# Patient Record
Sex: Female | Born: 1990 | Race: White | Hispanic: No | Marital: Married | Smoking: Never smoker
Health system: Southern US, Community
[De-identification: ages and names within clinical notes are randomized; demographics above are authoritative.]

## PROBLEM LIST (undated history)

## (undated) DIAGNOSIS — I10 Essential (primary) hypertension: Secondary | ICD-10-CM

## (undated) DIAGNOSIS — M329 Systemic lupus erythematosus, unspecified: Secondary | ICD-10-CM

## (undated) HISTORY — PX: ABDOMINAL HYSTERECTOMY: SHX81

## (undated) HISTORY — PX: TUBAL LIGATION: SHX77

## (undated) HISTORY — PX: RENAL BIOPSY, PERCUTANEOUS: SUR144

---

## 2013-07-11 ENCOUNTER — Emergency Department (HOSPITAL_COMMUNITY): Payer: BC Managed Care – PPO

## 2013-07-11 ENCOUNTER — Emergency Department (HOSPITAL_COMMUNITY)
Admission: EM | Admit: 2013-07-11 | Discharge: 2013-07-11 | Disposition: A | Discharge status: Home / Self Care | Payer: BC Managed Care – PPO | Attending: Emergency Medicine | Admitting: Emergency Medicine

## 2013-07-11 ENCOUNTER — Encounter (HOSPITAL_COMMUNITY): Payer: Self-pay | Admitting: Emergency Medicine

## 2013-07-11 DIAGNOSIS — IMO0002 Reserved for concepts with insufficient information to code with codable children: Secondary | ICD-10-CM | POA: Insufficient documentation

## 2013-07-11 DIAGNOSIS — R7989 Other specified abnormal findings of blood chemistry: Secondary | ICD-10-CM

## 2013-07-11 DIAGNOSIS — R51 Headache: Secondary | ICD-10-CM | POA: Insufficient documentation

## 2013-07-11 DIAGNOSIS — R519 Headache, unspecified: Secondary | ICD-10-CM

## 2013-07-11 DIAGNOSIS — Z8739 Personal history of other diseases of the musculoskeletal system and connective tissue: Secondary | ICD-10-CM | POA: Insufficient documentation

## 2013-07-11 DIAGNOSIS — R112 Nausea with vomiting, unspecified: Secondary | ICD-10-CM

## 2013-07-11 DIAGNOSIS — Z79899 Other long term (current) drug therapy: Secondary | ICD-10-CM | POA: Insufficient documentation

## 2013-07-11 DIAGNOSIS — I10 Essential (primary) hypertension: Secondary | ICD-10-CM | POA: Insufficient documentation

## 2013-07-11 DIAGNOSIS — R748 Abnormal levels of other serum enzymes: Secondary | ICD-10-CM | POA: Insufficient documentation

## 2013-07-11 DIAGNOSIS — Z3202 Encounter for pregnancy test, result negative: Secondary | ICD-10-CM | POA: Insufficient documentation

## 2013-07-11 HISTORY — DX: Essential (primary) hypertension: I10

## 2013-07-11 HISTORY — DX: Systemic lupus erythematosus, unspecified: M32.9

## 2013-07-11 LAB — COMPREHENSIVE METABOLIC PANEL
ALBUMIN: 2.1 g/dL — AB (ref 3.5–5.2)
ALT: 8 U/L (ref 0–35)
AST: 12 U/L (ref 0–37)
Alkaline Phosphatase: 53 U/L (ref 39–117)
BUN: 20 mg/dL (ref 6–23)
CALCIUM: 8.1 mg/dL — AB (ref 8.4–10.5)
CHLORIDE: 115 meq/L — AB (ref 96–112)
CO2: 16 mEq/L — ABNORMAL LOW (ref 19–32)
CREATININE: 2.1 mg/dL — AB (ref 0.50–1.10)
GFR calc Af Amer: 37 mL/min — ABNORMAL LOW (ref >90)
GFR calc non Af Amer: 32 mL/min — ABNORMAL LOW (ref >90)
Glucose, Bld: 92 mg/dL (ref 70–99)
Potassium: 3.8 mEq/L (ref 3.7–5.3)
Sodium: 143 mEq/L (ref 137–147)
TOTAL PROTEIN: 5.1 g/dL — AB (ref 6.0–8.3)
Total Bilirubin: <0.2 mg/dL — ABNORMAL LOW (ref 0.3–1.2)

## 2013-07-11 LAB — CBC
HCT: 27.8 % — ABNORMAL LOW (ref 36.0–46.0)
Hemoglobin: 9.3 g/dL — ABNORMAL LOW (ref 12.0–15.0)
MCH: 30 pg (ref 26.0–34.0)
MCHC: 33.5 g/dL (ref 30.0–36.0)
MCV: 89.7 fL (ref 78.0–100.0)
PLATELETS: 190 10*3/uL (ref 150–400)
RBC: 3.1 MIL/uL — ABNORMAL LOW (ref 3.87–5.11)
RDW: 15 % (ref 11.5–15.5)
WBC: 1.9 10*3/uL — AB (ref 4.0–10.5)

## 2013-07-11 LAB — POC URINE PREG, ED: Preg Test, Ur: NEGATIVE

## 2013-07-11 LAB — LIPASE, BLOOD: Lipase: 13 U/L (ref 11–59)

## 2013-07-11 MED ORDER — PROCHLORPERAZINE EDISYLATE 5 MG/ML IJ SOLN
10.0000 mg | Freq: Once | INTRAMUSCULAR | Status: AC
  Administered 2013-07-11: 10 mg via INTRAVENOUS
  Filled 2013-07-11: qty 2

## 2013-07-11 MED ORDER — KETOROLAC TROMETHAMINE 30 MG/ML IJ SOLN
30.0000 mg | Freq: Once | INTRAMUSCULAR | Status: AC
  Administered 2013-07-11: 30 mg via INTRAVENOUS
  Filled 2013-07-11: qty 1

## 2013-07-11 MED ORDER — SODIUM CHLORIDE 0.9 % IV BOLUS (SEPSIS)
1000.0000 mL | Freq: Once | INTRAVENOUS | Status: AC
  Administered 2013-07-11: 1000 mL via INTRAVENOUS

## 2013-07-11 MED ORDER — DIPHENHYDRAMINE HCL 50 MG/ML IJ SOLN
12.5000 mg | Freq: Once | INTRAMUSCULAR | Status: AC
  Administered 2013-07-11: 12.5 mg via INTRAVENOUS
  Filled 2013-07-11: qty 1

## 2013-07-11 MED ORDER — LABETALOL HCL 5 MG/ML IV SOLN
20.0000 mg | Freq: Once | INTRAVENOUS | Status: AC
  Administered 2013-07-11: 20 mg via INTRAVENOUS
  Filled 2013-07-11: qty 4

## 2013-07-11 NOTE — ED Provider Notes (Signed)
  Medical screening examination/treatment/procedure(s) were performed by non-physician practitioner and as supervising physician I was immediately available for consultation/collaboration.   EKG Interpretation   Date/Time:  Saturday July 11 2013 19:12:34 EDT Ventricular Rate:  74 PR Interval:  165 QRS Duration: 108 QT Interval:  441 QTC Calculation: 489 R Axis:   51 Text Interpretation:  Sinus rhythm Borderline prolonged QT interval Sinus  rhythm borderline LVH Abnormal ekg Confirmed by Gerhard MunchLOCKWOOD, Les Longmore  MD  ([id number]) on 07/11/2013 7:20:03 PM         Gerhard Munchobert Ranesha Val, MD 07/11/13 2325

## 2013-07-11 NOTE — Discharge Instructions (Signed)
1. Medications: usual home medications 2. Treatment: rest, drink plenty of fluids,  3. Follow Up: Please followup with your primary doctor for discussion of your diagnoses and further evaluation after today's visit; if you do not have a primary care doctor use the resource guide provided to find one;   Hypertension As your heart beats, it forces blood through your arteries. This force is your blood pressure. If the pressure is too high, it is called hypertension (HTN) or high blood pressure. HTN is dangerous because you may have it and not know it. High blood pressure may mean that your heart has to work harder to pump blood. Your arteries may be narrow or stiff. The extra work puts you at risk for heart disease, stroke, and other problems.  Blood pressure consists of two numbers, a higher number over a lower, 110/72, for example. It is stated as "110 over 72." The ideal is below 120 for the top number (systolic) and under 80 for the bottom (diastolic). Write down your blood pressure today. You should pay close attention to your blood pressure if you have certain conditions such as:  Heart failure.  Prior heart attack.  Diabetes  Chronic kidney disease.  Prior stroke.  Multiple risk factors for heart disease. To see if you have HTN, your blood pressure should be measured while you are seated with your arm held at the level of the heart. It should be measured at least twice. A one-time elevated blood pressure reading (especially in the Emergency Department) does not mean that you need treatment. There may be conditions in which the blood pressure is different between your right and left arms. It is important to see your caregiver soon for a recheck. Most people have essential hypertension which means that there is not a specific cause. This type of high blood pressure may be lowered by changing lifestyle factors such as:  Stress.  Smoking.  Lack of exercise.  Excessive  weight.  Drug/tobacco/alcohol use.  Eating less salt. Most people do not have symptoms from high blood pressure until it has caused damage to the body. Effective treatment can often prevent, delay or reduce that damage. TREATMENT  When a cause has been identified, treatment for high blood pressure is directed at the cause. There are a large number of medications to treat HTN. These fall into several categories, and your caregiver will help you select the medicines that are best for you. Medications may have side effects. You should review side effects with your caregiver. If your blood pressure stays high after you have made lifestyle changes or started on medicines,   Your medication(s) may need to be changed.  Other problems may need to be addressed.  Be certain you understand your prescriptions, and know how and when to take your medicine.  Be sure to follow up with your caregiver within the time frame advised (usually within two weeks) to have your blood pressure rechecked and to review your medications.  If you are taking more than one medicine to lower your blood pressure, make sure you know how and at what times they should be taken. Taking two medicines at the same time can result in blood pressure that is too low. SEEK IMMEDIATE MEDICAL CARE IF:  You develop a severe headache, blurred or changing vision, or confusion.  You have unusual weakness or numbness, or a faint feeling.  You have severe chest or abdominal pain, vomiting, or breathing problems. MAKE SURE YOU:   Understand these  instructions.  Will watch your condition.  Will get help right away if you are not doing well or get worse. Document Released: 04/16/2005 Document Revised: 07/09/2011 Document Reviewed: 12/05/2007 [facility] Patient Information 2014 [place], [place].

## 2013-07-11 NOTE — ED Provider Notes (Signed)
CSN: [id number]     Arrival date & time 07/11/13  1613 History   First MD Initiated Contact with Patient 07/11/13 1646     Chief Complaint  Patient presents with  . Headache  . Hypertension     (Consider location/radiation/quality/duration/timing/severity/associated sxs/prior Treatment) The history is provided by the patient and medical records. No language interpreter was used.    Shannon Lane is a 23 y.o. female  with a hx of lupus, HTN presents to the Emergency Department complaining of gradual, persistent, progressively worsening generalized throbbing headache rated at a 9/10 onset 10am after vomiting. Pt reports that she does get headaches and reports that this headache feels the same but is more intense.  She reports that about 1/2 the time the headache will resolve after sleep, but it did not today.  She reports she checked her BP at [facility] this AM and it was high.  She reports generally feeling unwell this morning which prompted her to check her BP.  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 She is taking Labetalol, clonidine and hydralazine.  She reports she only took the hydralazine this AM, but vomited it up.  She reports that when she takes all of them, she vomits and becomes sleepy.  Pt reports that once she vomits, she takes no more of her medications for the rest of the day.  Pt report that she took tylenol this AM before vomiting and it did not help.  Nothing makes the headache better or worse.  Pt denies fever, chills, neck pain, neck stiffness, chest pain, SOB, abd pain, diarrhea, weakness, dizziness, syncope, dysuria.      Past Medical History  Diagnosis Date  . Lupus    Past Surgical History  Procedure Laterality Date  . Renal biopsy, percutaneous    . Abdominal hysterectomy    . Tubal ligation     No family history on file. History  Substance Use Topics  . Smoking status: Never Smoker   . Smokeless tobacco: Not on file  . Alcohol Use: Yes   Comment: Occasional mixed drink   OB History   Grav Para Term Preterm Abortions TAB SAB Ect Mult Living                 Review of Systems  Constitutional: Negative for fever, diaphoresis, appetite change, fatigue and unexpected weight change.  HENT: Negative for mouth sores.   Eyes: Negative for visual disturbance.  Respiratory: Negative for cough, chest tightness, shortness of breath and wheezing.   Cardiovascular: Negative for chest pain.  Gastrointestinal: Positive for nausea (now resolved). Negative for vomiting, abdominal pain, diarrhea and constipation.  Endocrine: Negative for polydipsia, polyphagia and polyuria.  Genitourinary: Negative for dysuria, urgency, frequency and hematuria.  Musculoskeletal: Negative for back pain and neck stiffness.  Skin: Negative for rash.  Allergic/Immunologic: Negative for immunocompromised state.  Neurological: Positive for headaches. Negative for syncope and light-headedness.  Hematological: Does not bruise/bleed easily.  Psychiatric/Behavioral: Negative for sleep disturbance. The patient is not nervous/anxious.       Allergies  Dapsone and Sulfa antibiotics  Home Medications   Current Outpatient Rx  Name  Route  Sig  Dispense  Refill  . acetaminophen (TYLENOL) 500 MG tablet   Oral   Take 1,500 mg by mouth every 6 (six) hours as needed for headache.         . bumetanide (BUMEX) 1 MG tablet   Oral   Take 1 mg by mouth 3 (three)  times daily.         . cloNIDine (CATAPRES) 0.2 MG tablet   Oral   Take 0.2 mg by mouth 2 (two) times daily.         . DULoxetine (CYMBALTA) 60 MG capsule   Oral   Take 60 mg by mouth daily.         . hydroxychloroquine (PLAQUENIL) 200 MG tablet   Oral   Take 400 mg by mouth every morning.         . labetalol (NORMODYNE) 200 MG tablet   Oral   Take 600 mg by mouth 3 (three) times daily.         . potassium chloride SA (K-DUR,KLOR-CON) 20 MEQ tablet   Oral   Take 1,000 mEq by mouth  daily.         . predniSONE (DELTASONE) 5 MG tablet   Oral   Take 5 mg by mouth daily.         . sodium bicarbonate 650 MG tablet   Oral   Take 650 mg by mouth daily.         . sodium chloride 0.9 % SOLN 250 mL with cyclophosphamide 1 G SOLR   Intravenous   Inject 600 mg/m2 into the vein every 30 (thirty) days.         Marland Kitchen spironolactone (ALDACTONE) 25 MG tablet   Oral   Take 50 mg by mouth daily.          BP 152/87  Pulse 91  Resp 16  SpO2 99% Physical Exam  Nursing note and vitals reviewed. Constitutional: She is oriented to person, place, and time. She appears well-developed and well-nourished. No distress.  Awake, alert, nontoxic appearance  HENT:  Head: Normocephalic and atraumatic.  Mouth/Throat: Oropharynx is clear and moist. No oropharyngeal exudate.  Eyes: Conjunctivae and EOM are normal. Pupils are equal, round, and reactive to light. No scleral icterus.  Neck: Normal range of motion and full passive range of motion without pain. Neck supple. No spinous process tenderness and no muscular tenderness present. No rigidity. Normal range of motion present. No Brudzinski's sign and no Kernig's sign noted.  Full ROM without pain No midline or paraspinal tenderness  Cardiovascular: Normal rate, regular rhythm, normal heart sounds and intact distal pulses.   No murmur heard. Pulmonary/Chest: Effort normal and breath sounds normal. No respiratory distress. She has no wheezes. She has no rales.  Abdominal: Soft. Bowel sounds are normal. She exhibits no distension and no mass. There is no tenderness. There is no rebound and no guarding.  Musculoskeletal: Normal range of motion. She exhibits no edema.  Lymphadenopathy:    She has no cervical adenopathy.  Neurological: She is alert and oriented to person, place, and time. She has normal reflexes. No cranial nerve deficit. She exhibits normal muscle tone. Coordination normal.  Speech is clear and goal oriented, follows  commands Cranial nerves III - XII without deficit, no facial droop Normal strength in upper and lower extremities bilaterally, strong and equal grip strength Sensation normal to light and sharp touch Moves extremities without ataxia, coordination intact Normal finger to nose and rapid alternating movements Neg romberg, no pronator drift Normal gait Normal heel-shin and balance   Skin: Skin is warm and dry. No rash noted. She is not diaphoretic. No erythema.  Psychiatric: She has a normal mood and affect. Her behavior is normal. Judgment and thought content normal.    ED Course  Procedures (including critical care  time) Labs Review Labs Reviewed  CBC - Abnormal; Notable for the following:    WBC 1.9 (*)    RBC 3.10 (*)    Hemoglobin 9.3 (*)    HCT 27.8 (*)    All other components within normal limits  COMPREHENSIVE METABOLIC PANEL - Abnormal; Notable for the following:    Chloride 115 (*)    CO2 16 (*)    Creatinine, Ser 2.10 (*)    Calcium 8.1 (*)    Total Protein 5.1 (*)    Albumin 2.1 (*)    Total Bilirubin <0.2 (*)    GFR calc non Af Amer 32 (*)    GFR calc Af Amer 37 (*)    All other components within normal limits  LIPASE, BLOOD  POC URINE PREG, ED   Imaging Review Ct Head Wo Contrast  07/11/2013   CLINICAL DATA:  Frontal headache  EXAM: CT HEAD WITHOUT CONTRAST  TECHNIQUE: Contiguous axial images were obtained from the base of the skull through the vertex without intravenous contrast.  COMPARISON:  None.  FINDINGS: No acute hemorrhage, infarct, or mass lesion is identified. Minimal ethmoid mucoperiosteal thickening noted. Orbits are grossly unremarkable. No skull fracture. No midline shift.  IMPRESSION: Minimal ethmoid sinusitis.  No acute intracranial findings.   Electronically Signed   By: Christiana PellantGretchen  Green M.D.   On: 07/11/2013 18:06     EKG Interpretation   Date/Time:  Saturday July 11 2013 19:12:34 EDT Ventricular Rate:  74 PR Interval:  165 QRS Duration:  108 QT Interval:  441 QTC Calculation: 489 R Axis:   51 Text Interpretation:  Sinus rhythm Borderline prolonged QT interval Sinus  rhythm borderline LVH Abnormal ekg Confirmed by Gerhard MunchLOCKWOOD, ROBERT  MD  [id number]) on 07/11/2013 7:20:03 PM      MDM   Final diagnoses:  HTN (hypertension)  Headache  Nausea and vomiting  Elevated serum creatinine   Tiburcio PeaKristen Herda presents with a Hx of lupus induced kidney dysfunction and HTN.  Today she has a headache and has had several episodes of emesis.  Symptoms today are not new to the patient as she has had them before, but they seemed more intense today.  Pt has not had her home BP medications due to her vomiting.  Low likelihood of subarachnoid hemorrhage however will obtain CT scan. Also obtain basic labs and get blood pressure control and fluids.  Patient to be given labetalol 20 mg IV.  6:15PM Patient reports improving headache. CBC with mild leukopenia and mild anemia at baseline per patient. Also noted exam creatinine at 2.10.  Pt. Reports she [place] a rheumatologist and [place]  Her creatinine last week was 2.1 therefore this is baseline for her.  Will complete fluid bolus and reassess.    7:23 PM On reassessment patient alert, oriented, nontoxic and nonseptic appearing.  Her EKG was nonischemic and her head CT was without evidence of acute intracranial abnormality.  I personally reviewed the imaging tests through PACS system  I reviewed available ER/hospitalization records through the EMR  No evidence of endorgan damage at this time as patient's elevated creatinine is at baseline for her.  She's tolerating by mouth here in the department without difficulty. She reports that she feels well enough to take her home blood pressure medications after being discharged.  Last set of vitals by me: BP (manual) 152/87 HR 88  It has been determined that no acute conditions requiring further emergency intervention are present at  this time. The patient/guardian have been advised of the diagnosis and plan. We have discussed signs and symptoms that warrant return to the ED, such as changes or worsening in symptoms.   Vital signs are stable at discharge.   BP 152/87  Pulse 91  Resp 16  SpO2 99%  Patient/guardian has voiced understanding and agreed to follow-up with the PCP or specialist.      Dierdre Forth, PA-C 07/11/13 1931

## 2013-07-11 NOTE — ED Notes (Signed)
She c/o generalized h/a today.  She recognizes this as a sign of her htn flaring.  She states she has lupus, and is on chronic steroids; cytoxan; plus several antihypertensives, including Labetolol 600mg  t.i.d.  She also states she has been nauseated today and vomits whenever she attempts to take her usual meds.  She is alert and oriented x 4 with clear speech.

## 2014-12-04 IMAGING — CT CT HEAD W/O CM
2 series · 17 of 30 positions shown, 20 images · non-contrast
Comparison: None.

CLINICAL DATA: Frontal headache

EXAM:
CT HEAD WITHOUT CONTRAST
TECHNIQUE: Contiguous axial images were obtained from the base of the skull
through the vertex without intravenous contrast.

[Series 2: head w/o · axial · non-contrast · 0.40mm/px · z∈[+1089,+1209]mm · 9 of 30 slices shown, 12 images]
[im 3/30  brain]
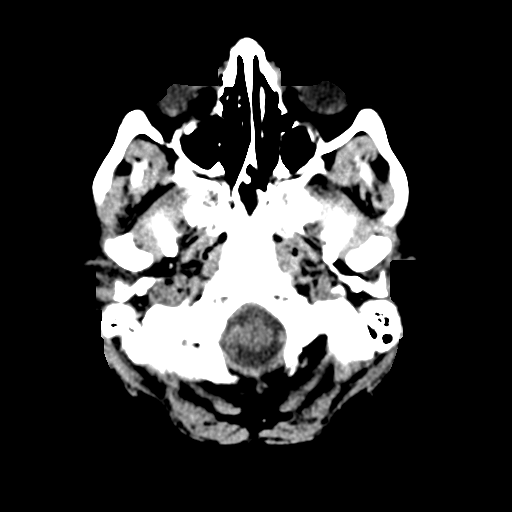
[im 3/30  bone]
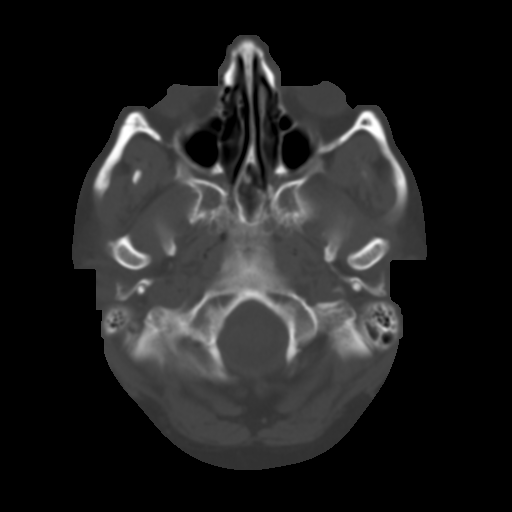
[im 6/30  brain]
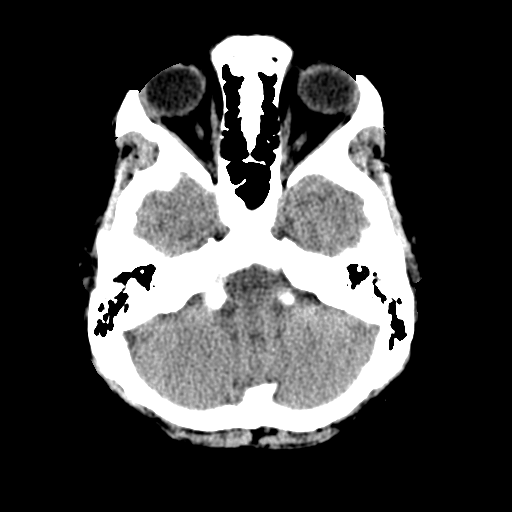
[im 9/30  brain]
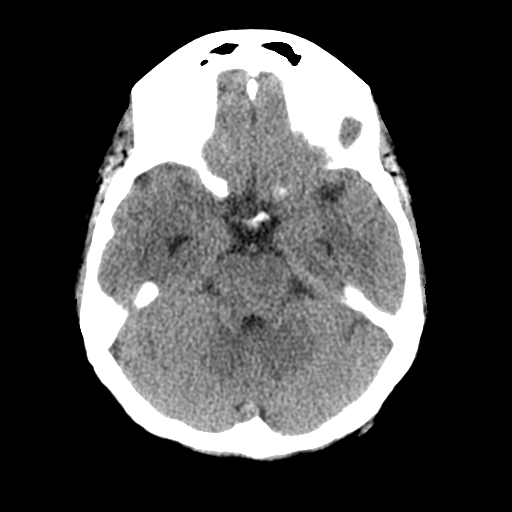
[im 12/30  brain]
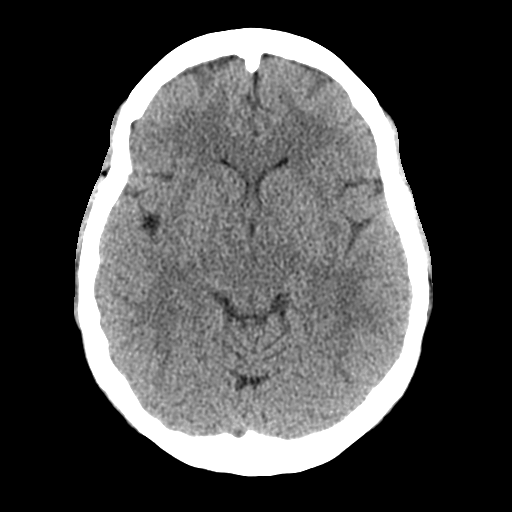
[im 15/30  brain]
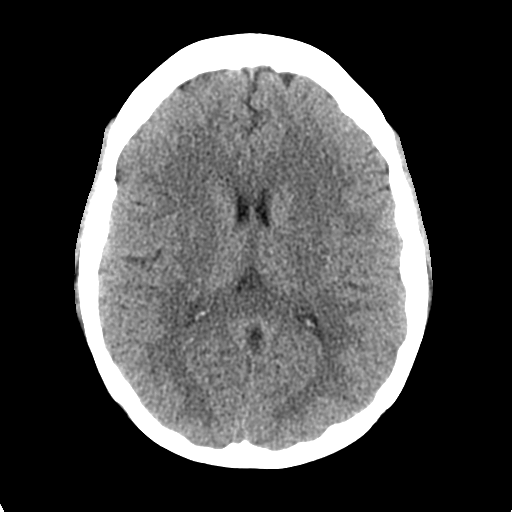
[im 15/30  bone]
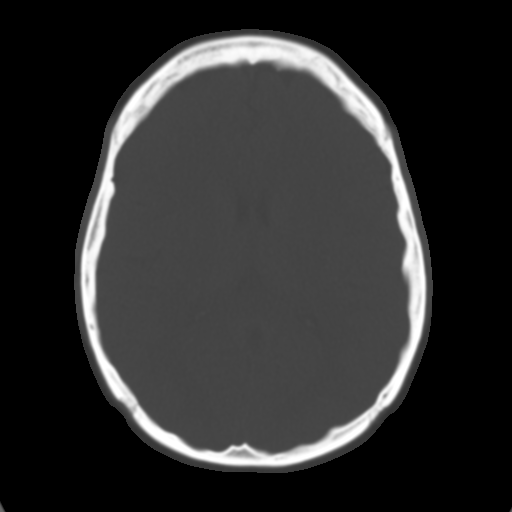
[im 18/30  brain]
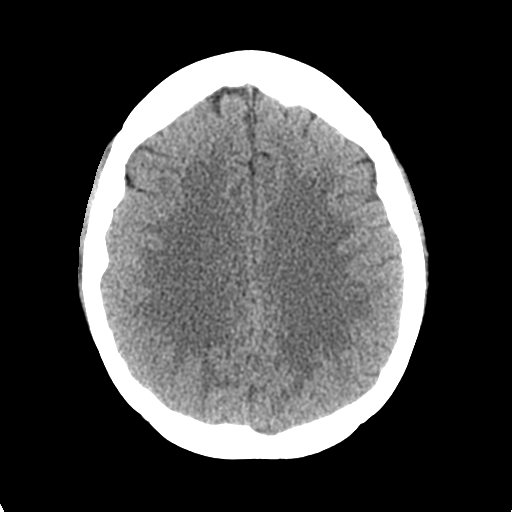
[im 21/30  brain]
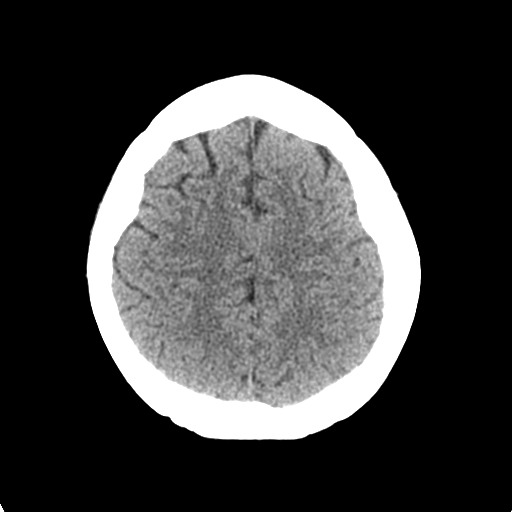
[im 24/30  brain]
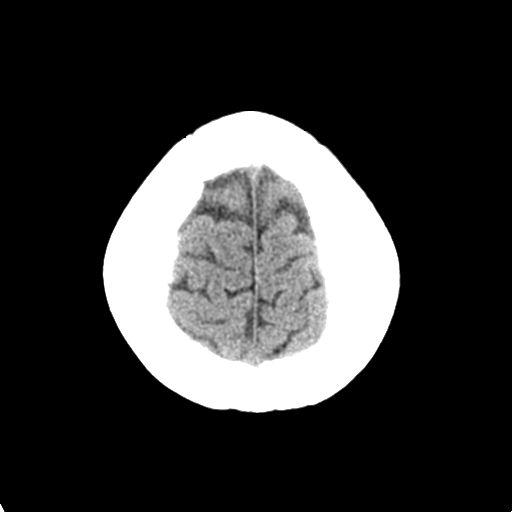
[im 27/30  brain]
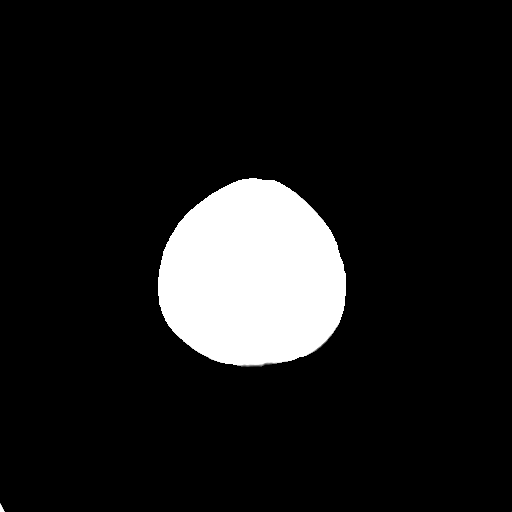
[im 27/30  bone]
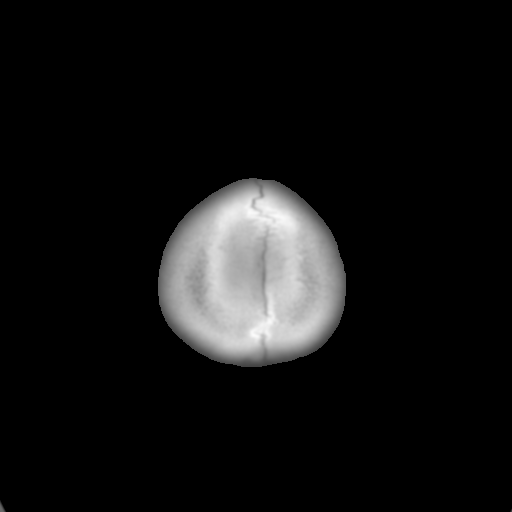

[Series 3: bone windows · axial · 0.40mm/px · z∈[+1094,+1205]mm · 8 of 49 slices shown]
[im 6/49  bone]
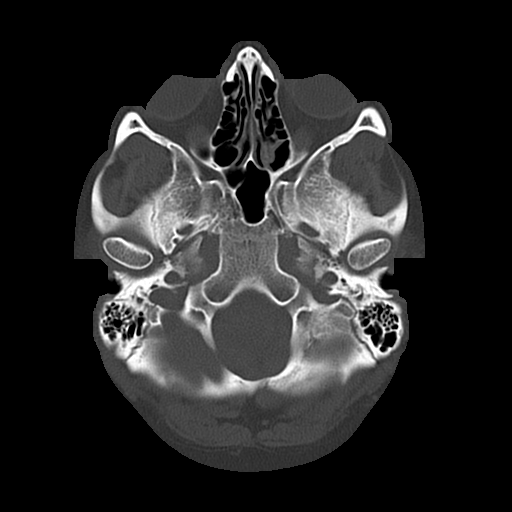
[im 11/49  bone]
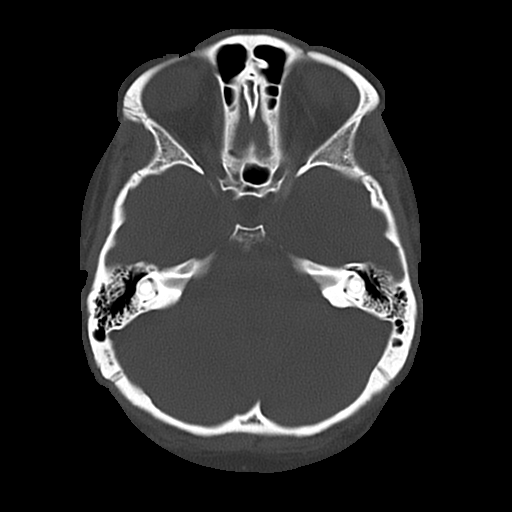
[im 17/49  bone]
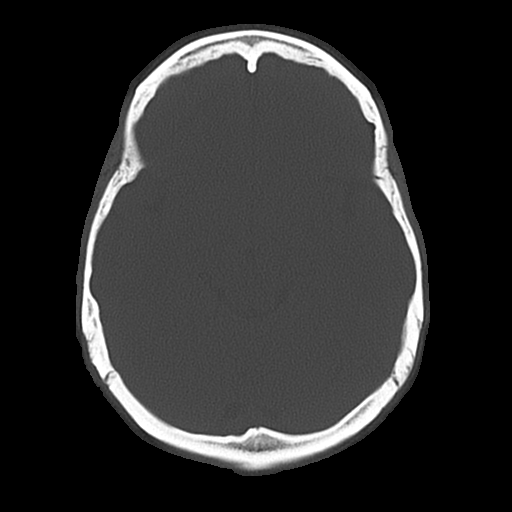
[im 22/49  bone]
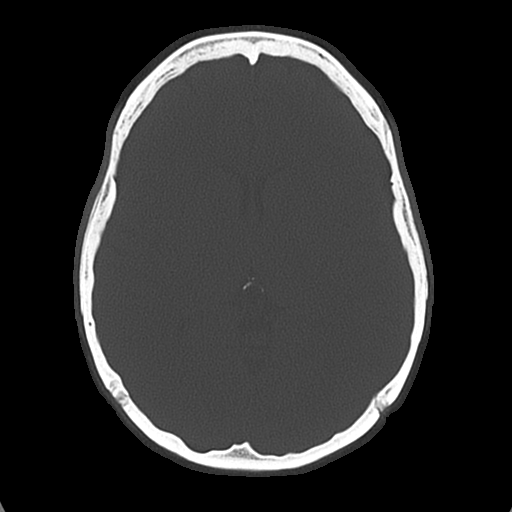
[im 27/49  bone]
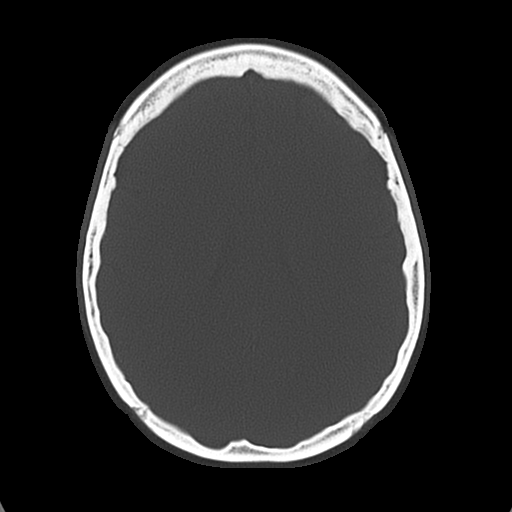
[im 33/49  bone]
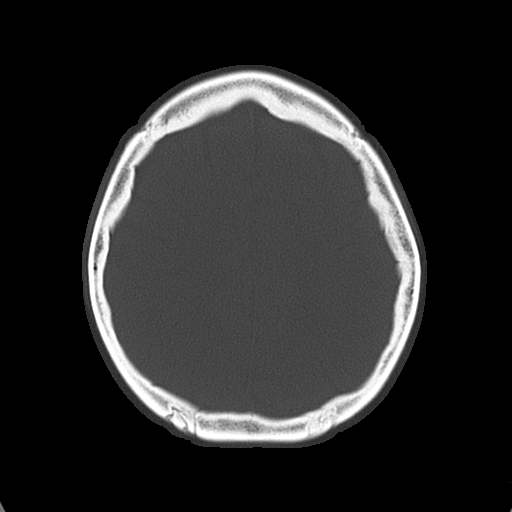
[im 38/49  bone]
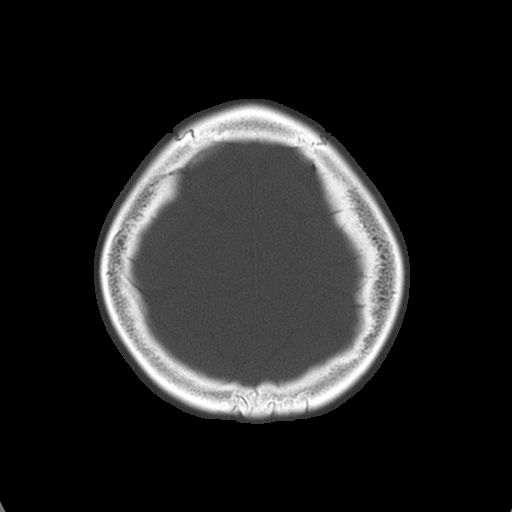
[im 43/49  bone]
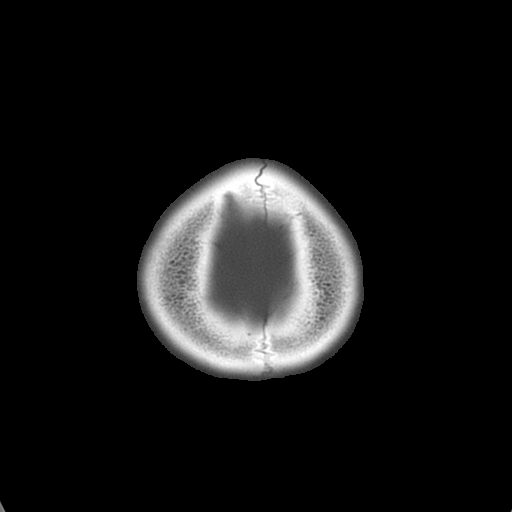

[17 of 30 positions shown; findings below may reference images not displayed]

FINDINGS: No acute hemorrhage, infarct, or mass lesion is identified. Minimal
ethmoid mucoperiosteal thickening noted. Orbits are grossly
unremarkable. No skull fracture. No midline shift.
IMPRESSION: Minimal ethmoid sinusitis.  No acute intracranial findings.
